# Patient Record
Sex: Male | Born: 1965 | Race: Black or African American | Hispanic: No | Marital: Married | Smoking: Never smoker
Health system: Southern US, Community
[De-identification: ages and names within clinical notes are randomized; demographics above are authoritative.]

## PROBLEM LIST (undated history)

## (undated) DIAGNOSIS — E669 Obesity, unspecified: Secondary | ICD-10-CM

## (undated) DIAGNOSIS — E119 Type 2 diabetes mellitus without complications: Secondary | ICD-10-CM

## (undated) DIAGNOSIS — I480 Paroxysmal atrial fibrillation: Secondary | ICD-10-CM

## (undated) DIAGNOSIS — F1010 Alcohol abuse, uncomplicated: Secondary | ICD-10-CM

## (undated) DIAGNOSIS — I10 Essential (primary) hypertension: Secondary | ICD-10-CM

## (undated) DIAGNOSIS — Z9289 Personal history of other medical treatment: Secondary | ICD-10-CM

## (undated) HISTORY — DX: Obesity, unspecified: E66.9

## (undated) HISTORY — DX: Type 2 diabetes mellitus without complications: E11.9

## (undated) HISTORY — DX: Paroxysmal atrial fibrillation: I48.0

## (undated) HISTORY — DX: Personal history of other medical treatment: Z92.89

---

## 2007-07-17 ENCOUNTER — Ambulatory Visit: Payer: Self-pay | Admitting: Internal Medicine

## 2015-01-18 ENCOUNTER — Inpatient Hospital Stay
Admission: EM | Admit: 2015-01-18 | Discharge: 2015-01-18 | DRG: 310 | Disposition: A | Discharge status: Home / Self Care | Payer: Self-pay | Attending: Internal Medicine | Admitting: Internal Medicine

## 2015-01-18 ENCOUNTER — Encounter: Payer: Self-pay | Admitting: Emergency Medicine

## 2015-01-18 ENCOUNTER — Inpatient Hospital Stay: Admit: 2015-01-18 | Payer: Self-pay

## 2015-01-18 ENCOUNTER — Inpatient Hospital Stay (HOSPITAL_COMMUNITY)
Admit: 2015-01-18 | Discharge: 2015-01-18 | Disposition: A | Discharge status: Home / Self Care | Payer: Self-pay | Attending: Internal Medicine | Admitting: Internal Medicine

## 2015-01-18 ENCOUNTER — Emergency Department: Payer: Self-pay

## 2015-01-18 DIAGNOSIS — I480 Paroxysmal atrial fibrillation: Principal | ICD-10-CM | POA: Diagnosis present

## 2015-01-18 DIAGNOSIS — I4891 Unspecified atrial fibrillation: Secondary | ICD-10-CM

## 2015-01-18 DIAGNOSIS — E119 Type 2 diabetes mellitus without complications: Secondary | ICD-10-CM | POA: Diagnosis present

## 2015-01-18 DIAGNOSIS — E876 Hypokalemia: Secondary | ICD-10-CM | POA: Diagnosis present

## 2015-01-18 DIAGNOSIS — Z9119 Patient's noncompliance with other medical treatment and regimen: Secondary | ICD-10-CM | POA: Diagnosis present

## 2015-01-18 DIAGNOSIS — I10 Essential (primary) hypertension: Secondary | ICD-10-CM | POA: Diagnosis present

## 2015-01-18 DIAGNOSIS — Z8249 Family history of ischemic heart disease and other diseases of the circulatory system: Secondary | ICD-10-CM

## 2015-01-18 HISTORY — DX: Alcohol abuse, uncomplicated: F10.10

## 2015-01-18 HISTORY — DX: Essential (primary) hypertension: I10

## 2015-01-18 LAB — CBC
HEMATOCRIT: 45.7 % (ref 40.0–52.0)
Hemoglobin: 15.5 g/dL (ref 13.0–18.0)
MCH: 31.9 pg (ref 26.0–34.0)
MCHC: 33.9 g/dL (ref 32.0–36.0)
MCV: 94.1 fL (ref 80.0–100.0)
PLATELETS: 160 10*3/uL (ref 150–440)
RBC: 4.86 MIL/uL (ref 4.40–5.90)
RDW: 13 % (ref 11.5–14.5)
WBC: 4 10*3/uL (ref 3.8–10.6)

## 2015-01-18 LAB — BASIC METABOLIC PANEL
ANION GAP: 12 (ref 5–15)
Anion gap: 7 (ref 5–15)
BUN: 10 mg/dL (ref 6–20)
BUN: 12 mg/dL (ref 6–20)
CHLORIDE: 102 mmol/L (ref 101–111)
CO2: 25 mmol/L (ref 22–32)
CO2: 28 mmol/L (ref 22–32)
CREATININE: 0.81 mg/dL (ref 0.61–1.24)
Calcium: 9.2 mg/dL (ref 8.9–10.3)
Calcium: 9.3 mg/dL (ref 8.9–10.3)
Chloride: 105 mmol/L (ref 101–111)
Creatinine, Ser: 0.85 mg/dL (ref 0.61–1.24)
GFR calc Af Amer: >60 mL/min (ref >60)
GLUCOSE: 160 mg/dL — AB (ref 65–99)
Glucose, Bld: 232 mg/dL — ABNORMAL HIGH (ref 65–99)
POTASSIUM: 3 mmol/L — AB (ref 3.5–5.1)
Potassium: 4.4 mmol/L (ref 3.5–5.1)
SODIUM: 139 mmol/L (ref 135–145)
SODIUM: 140 mmol/L (ref 135–145)

## 2015-01-18 LAB — ETHANOL: Alcohol, Ethyl (B): 17 mg/dL — ABNORMAL HIGH (ref <5)

## 2015-01-18 LAB — TSH: TSH: 0.838 u[IU]/mL (ref 0.350–4.500)

## 2015-01-18 LAB — URINE DRUG SCREEN, QUALITATIVE (ARMC ONLY)
Amphetamines, Ur Screen: NOT DETECTED
BARBITURATES, UR SCREEN: NOT DETECTED
BENZODIAZEPINE, UR SCRN: NOT DETECTED
COCAINE METABOLITE, UR ~~LOC~~: NOT DETECTED
Cannabinoid 50 Ng, Ur ~~LOC~~: NOT DETECTED
MDMA (Ecstasy)Ur Screen: NOT DETECTED
METHADONE SCREEN, URINE: NOT DETECTED
Opiate, Ur Screen: NOT DETECTED
Phencyclidine (PCP) Ur S: NOT DETECTED
TRICYCLIC, UR SCREEN: NOT DETECTED

## 2015-01-18 LAB — TROPONIN I
TROPONIN I: 0.04 ng/mL — AB (ref <0.031)
Troponin I: <0.03 ng/mL (ref <0.031)

## 2015-01-18 LAB — PHOSPHORUS: Phosphorus: 3.8 mg/dL (ref 2.5–4.6)

## 2015-01-18 LAB — MAGNESIUM: Magnesium: 2.2 mg/dL (ref 1.7–2.4)

## 2015-01-18 LAB — HEMOGLOBIN A1C: HEMOGLOBIN A1C: 6.6 % — AB (ref 4.0–6.0)

## 2015-01-18 MED ORDER — ASPIRIN 81 MG PO TBEC: 81.0000 mg | DELAYED_RELEASE_TABLET | Freq: Every day | ORAL | Status: AC

## 2015-01-18 MED ORDER — DILTIAZEM HCL ER COATED BEADS 180 MG PO CP24
180.0000 mg | ORAL_CAPSULE | Freq: Every day | ORAL | Status: DC
Start: 2015-01-18 — End: 2015-02-12

## 2015-01-18 MED ORDER — DILTIAZEM HCL 60 MG PO TABS
60.0000 mg | ORAL_TABLET | Freq: Four times a day (QID) | ORAL | Status: DC
  Administered 2015-01-18: 60 mg via ORAL
  Filled 2015-01-18: qty 2

## 2015-01-18 MED ORDER — METOPROLOL TARTRATE 25 MG PO TABS
25.0000 mg | ORAL_TABLET | Freq: Two times a day (BID) | ORAL | Status: DC
  Administered 2015-01-18: 25 mg via ORAL
  Filled 2015-01-18: qty 1

## 2015-01-18 MED ORDER — METOPROLOL TARTRATE 1 MG/ML IV SOLN
5.0000 mg | Freq: Once | INTRAVENOUS | Status: AC
  Administered 2015-01-18: 5 mg via INTRAVENOUS
  Filled 2015-01-18: qty 5

## 2015-01-18 MED ORDER — DILTIAZEM HCL ER COATED BEADS 120 MG PO CP24
120.0000 mg | ORAL_CAPSULE | Freq: Every day | ORAL | Status: DC
Start: 2015-01-18 — End: 2015-01-18

## 2015-01-18 MED ORDER — METOPROLOL TARTRATE 25 MG PO TABS: 25.0000 mg | ORAL_TABLET | Freq: Two times a day (BID) | ORAL | Status: DC

## 2015-01-18 MED ORDER — ASPIRIN EC 81 MG PO TBEC
81.0000 mg | DELAYED_RELEASE_TABLET | Freq: Every day | ORAL | Status: DC
  Administered 2015-01-18: 81 mg via ORAL
  Filled 2015-01-18: qty 1

## 2015-01-18 MED ORDER — ENOXAPARIN SODIUM 40 MG/0.4ML ~~LOC~~ SOLN
40.0000 mg | SUBCUTANEOUS | Status: DC
  Administered 2015-01-18: 40 mg via SUBCUTANEOUS
  Filled 2015-01-18: qty 0.4

## 2015-01-18 MED ORDER — ENOXAPARIN SODIUM 40 MG/0.4ML ~~LOC~~ SOLN: 40.0000 mg | SUBCUTANEOUS | Status: DC

## 2015-01-18 MED ORDER — DILTIAZEM HCL ER COATED BEADS 120 MG PO CP24
120.0000 mg | ORAL_CAPSULE | Freq: Every day | ORAL | Status: DC
  Administered 2015-01-18: 120 mg via ORAL
  Filled 2015-01-18: qty 1

## 2015-01-18 MED ORDER — SODIUM CHLORIDE 0.9 % IJ SOLN
3.0000 mL | Freq: Two times a day (BID) | INTRAMUSCULAR | Status: DC
  Administered 2015-01-18: 3 mL via INTRAVENOUS

## 2015-01-18 MED ORDER — METOPROLOL TARTRATE 50 MG PO TABS: 50.0000 mg | ORAL_TABLET | Freq: Two times a day (BID) | ORAL | Status: DC

## 2015-01-18 MED ORDER — POTASSIUM CHLORIDE CRYS ER 20 MEQ PO TBCR
40.0000 meq | EXTENDED_RELEASE_TABLET | Freq: Once | ORAL | Status: AC
  Administered 2015-01-18: 40 meq via ORAL
  Filled 2015-01-18: qty 2

## 2015-01-18 MED ORDER — DILTIAZEM HCL 25 MG/5ML IV SOLN
20.0000 mg | Freq: Once | INTRAVENOUS | Status: AC
  Administered 2015-01-18: 20 mg via INTRAVENOUS
  Filled 2015-01-18: qty 5

## 2015-01-18 NOTE — Care Management (Signed)
Patient admitted with new onset of atrial fib.  He has no payor. He is employed and his wife is employed with school system.  She has insurance but it costs too much to put patient on the policy.  Patient's take home salary a month is 2500 dollar.  This does not include his wife's salary.  There is one dependent in addition to his wife.  Patient and wife are gong to look at some more "Obama Care" policies.  Patient has not been seen by a physician in over a year.  Discussed the need to  Get reestablished with a primary care physician.   Discussed that CM will continue to assess for discharge meds to determine if any assistance is needed.

## 2015-01-18 NOTE — Progress Notes (Signed)
*  PRELIMINARY RESULTS* Echocardiogram 2D Echocardiogram has been performed.  Jordan Cochran 01/18/2015, 4:31 PM

## 2015-01-18 NOTE — H&P (Signed)
[facility] - [place] at [facility]   PATIENT NAME: Jordan Cochran    MR#:  [id number]  DATE OF BIRTH:  1966/04/20  DATE OF ADMISSION:  01/18/2015  PRIMARY CARE PHYSICIAN: No PCP Per Patient   REQUESTING/REFERRING PHYSICIAN: Dr. Manson Passey  CHIEF COMPLAINT:   Chief Complaint  Patient presents with  . Palpitations    HISTORY OF PRESENT ILLNESS:  Jordan Cochran  is a 49 y.o. male with a known history of hypertension, not on any medications presents to the emergency room with the complaints of episode of palpitations. Patient states that when he woke up from sleep to go to the bathroom, he felt palpitations hence called EMS who found him with atrial fibrillation with rapid ventricular rate. He was given IV Cardizem 20 m by the EMS and brought to the emergency room for further evaluation. In the emergency room patient was found to be still with atrial fibrillation with rapid ventricular rate, stable blood pressure, was given additional dose of 20 mg IV Cardizem following which his heart rate, controlled and currently staying in the 90s. Denies any associated chest pain, dizziness, nausea, vomiting, diarrhea, diaphoresis. No history of similar symptoms in the past. Further workup revealed potassium of 3.0, blood sugar 232, troponin less than 0.03, EtOH level 17, urine tox negative. Chest x-ray negative for any acute cardiopulmonary pathology. EKG atrial fibrillation with rapid ventricular rate of 1 44 bpm. Patient admits having couple of beers before going to bed last night. Hospitalist service was consulted for further management. Patient is currently comfortably resting in the bed and denies any complaints such as  chest pain, palpitations, shortness of breath, dizziness.  PAST MEDICAL HISTORY:   Past Medical History  Diagnosis Date  . Hypertension     PAST SURGICAL HISTORY:  History reviewed. No pertinent past surgical history.  SOCIAL HISTORY:   Social History   Substance Use Topics  . Smoking status: Never Smoker   . Smokeless tobacco: Not on file  . Alcohol Use: Yes     Comment: 3 drinks and 2 shots today. 4/7 days    FAMILY HISTORY:   Family History  Problem Relation Age of Onset  . Hypertension Mother   . Hypertension Father   . Hypertension Sister   . Hypertension Brother     DRUG ALLERGIES:  No Known Allergies  REVIEW OF SYSTEMS:   Review of Systems  Constitutional: Negative for fever, chills and malaise/fatigue.  HENT: Negative for ear pain, hearing loss, nosebleeds, sore throat and tinnitus.   Eyes: Negative for blurred vision, double vision, pain, discharge and redness.  Respiratory: Negative for cough, hemoptysis, sputum production, shortness of breath and wheezing.   Cardiovascular: Positive for palpitations. Negative for chest pain, orthopnea and leg swelling.  Gastrointestinal: Negative for nausea, vomiting, abdominal pain, diarrhea, constipation, blood in stool and melena.  Genitourinary: Negative for dysuria, urgency, frequency and hematuria.  Musculoskeletal: Negative for back pain, joint pain and neck pain.  Skin: Negative for itching and rash.  Neurological: Negative for dizziness, tingling, sensory change, focal weakness and seizures.  Endo/Heme/Allergies: Does not bruise/bleed easily.  Psychiatric/Behavioral: Negative for depression. The patient is not nervous/anxious.     MEDICATIONS AT HOME:   Prior to Admission medications   Not on File      VITAL SIGNS:  Blood pressure 176/135, pulse 112, temperature 98.6 F (37 C), temperature source Oral, resp. rate 14, height  (1.702 m), weight 88.451 kg (195 lb), SpO2 98 %.  PHYSICAL EXAMINATION:  Physical Exam  Constitutional: He is oriented to person, place, and time. He appears well-developed and well-nourished. No distress.  HENT:  Head: Normocephalic and atraumatic.  Right Ear: External ear normal.  Left Ear: External ear normal.  Nose: Nose  normal.  Mouth/Throat: Oropharynx is clear and moist. No oropharyngeal exudate.  Eyes: EOM are normal. Pupils are equal, round, and reactive to light. No scleral icterus.  Neck: Normal range of motion. Neck supple. No JVD present. No thyromegaly present.  Cardiovascular: Normal rate, regular rhythm, normal heart sounds and intact distal pulses.  Exam reveals no friction rub.   No murmur heard. Respiratory: Effort normal and breath sounds normal. No respiratory distress. He has no wheezes. He has no rales. He exhibits no tenderness.  GI: Soft. Bowel sounds are normal. He exhibits no distension and no mass. There is no tenderness. There is no rebound and no guarding.  Musculoskeletal: Normal range of motion. He exhibits no edema.  Lymphadenopathy:    He has no cervical adenopathy.  Neurological: He is alert and oriented to person, place, and time. He has normal reflexes. He displays normal reflexes. No cranial nerve deficit. He exhibits normal muscle tone.  Skin: Skin is warm. No rash noted. No erythema.  Psychiatric: He has a normal mood and affect. His behavior is normal. Thought content normal.   LABORATORY PANEL:   CBC  Recent Labs Lab 01/18/15 0050  WBC 4.0  HGB 15.5  HCT 45.7  PLT 160   ------------------------------------------------------------------------------------------------------------------  Chemistries   Recent Labs Lab 01/18/15 0050  NA 139  K 3.0*  CL 102  CO2 25  GLUCOSE 232*  BUN 12  CREATININE 0.85  CALCIUM 9.2   ------------------------------------------------------------------------------------------------------------------  Cardiac Enzymes  Recent Labs Lab 01/18/15 0050  TROPONINI <0.03   ------------------------------------------------------------------------------------------------------------------  RADIOLOGY:  Dg Chest Port 1 View  01/18/2015   CLINICAL DATA:  Palpitations.  EXAM: PORTABLE CHEST 1 VIEW  COMPARISON:  None.  FINDINGS:  The heart is at the upper limits of normal in size. Mediastinal contours are normal. Pulmonary vasculature is normal. No consolidation, pleural effusion, or pneumothorax. No acute osseous abnormalities are seen.  IMPRESSION: Borderline cardiomegaly without localizing pulmonary process or congestive failure.   Electronically Signed   By: Rubye Oaks M.D.   On: 01/18/2015 01:24    EKG:   Orders placed or performed during the hospital encounter of 01/18/15  . EKG 12-Lead  . EKG 12-Lead  . ED EKG  . ED EKG  80 fibrillation with ventricular rate of 1 44 bpm, nonspecific ST-T abnormality.  IMPRESSION AND PLAN:   49 year old male with history of hypertension, not on any medications was brought into the emergency room by EMS with the complaints of palpitations, found to be in atrial fibrillation with rapid ventricular rate, received IV Cardizem 20 mg 2, currently heart rate maintaining in the 90s. 1. Atrial fibrillation with rapid ventricular rate, new onset. Likely triggered by alcohol usage. Rule out acute coronary event, rule out thyroid disease. History of hypertension, not using any medications currently. Patient hemodynamically stable. Plan: Admit to telemetry, cycle cardiac enzymes, continue aspirin. Start by mouth Cardizem 60 mg every 6 hours. Order echocardiogram, request cardiology consultation for further evaluation and advice. Check TSH. We will not start anti-combination since [place] score is 1 2. Hypertension, patient not using any medications at present. Plan: Start Cardizem, follow BP measurements closely. 3. Hypokalemia. Replace potassium. Check magnesium and phosphorus levels and follow-up accordingly. 4. Elevated blood  sugars of 232. No history of diabetes mellitus. Plan: Check HbA1c, follow-up accordingly.    All the records are reviewed and case discussed with ED Zyann Mabry. Management plans discussed with the patient, family and they are in agreement.  CODE STATUS: Full  code  TOTAL TIME TAKING CARE OF THIS PATIENT: 50 minutes.    Jonnie Kind N M.D on 01/18/2015 at 3:17 AM  Between 7am to 6pm - Pager - [phone]  After 6pm go to [email or web address] - password [email or web address] [facility]  [facility]  Office  [phone]  CC: Primary care physician; No PCP Per Patient

## 2015-01-18 NOTE — ED Notes (Signed)
Pt to ED from home via EMS c/o palpitations that started tonight around midnight after getting up to use the bathroom.  Pt states thought it might have been gas but "my heart just kept racing".  Per EMS pt has hx of HTN, took self off BP medication.  Pt presented in A.fib with EMS, NSR upon admission to ED, and back into a.fib.  EMS gave  cardiazem en route.  Pt is A&Ox4, speaking in complete and coherent sentences and in NAD at this time.

## 2015-01-18 NOTE — Discharge Instructions (Signed)
°  DIET:  Cardiac diet and Diabetic diet  DISCHARGE CONDITION:  Stable  ACTIVITY:  Activity as tolerated  OXYGEN:  Home Oxygen: No.   Oxygen Delivery: room air  DISCHARGE LOCATION:  home   If you experience worsening of your admission symptoms, develop shortness of breath, life threatening emergency, suicidal or homicidal thoughts you must seek medical attention immediately by calling 911 or calling your MD immediately  if symptoms less severe.  You Must read complete instructions/literature along with all the possible adverse reactions/side effects for all the Medicines you take and that have been prescribed to you. Take any new Medicines after you have completely understood and accpet all the possible adverse reactions/side effects.   Please note  You were cared for by a hospitalist during your hospital stay. If you have any questions about your discharge medications or the care you received while you were in the hospital after you are discharged, you can call the unit and asked to speak with the hospitalist on call if the hospitalist that took care of you is not available. Once you are discharged, your primary care physician will handle any further medical issues. Please note that NO REFILLS for any discharge medications will be authorized once you are discharged, as it is imperative that you return to your primary care physician (or establish a relationship with a primary care physician if you do not have one) for your aftercare needs so that they can reassess your need for medications and monitor your lab values.  Please take medications as directed here.  Prescriptions for cardizem 120 mg and metoprolol 25 mg were mistakenly sent earlier to pharmacy and you dont need them.

## 2015-01-18 NOTE — ED Provider Notes (Signed)
[facility] Emergency Department Provider Note  ____________________________________________  Time seen: 12:45 AM  I have reviewed the triage vital signs and the nursing notes.   HISTORY  Chief Complaint Palpitations     HPI Jordan Cochran is a 49 y.o. male presents with acute onset of palpitations tonight around midnight. Patient admits to the sensation as though his heart was "racing". Patient admits to left lower chest discomfort along with palpitation which she describes as mild. On EMS arrival EKG revealed atrial fibrillation with rapid ventricular response with a heart rate of 140-180. as such patient was given 20 mg of Cardizem with improvement of heart rate to low 100s". In addition patient was noted to be markedly hypertensive on EMS arrival with a systolic blood pressure of 240. Patient denies any dizziness no nausea or vomiting. Of note patient does admit to alcohol ingestion last night at approximately 7 PM. Patient denies any illicit drug use. In addition patient has a history of hypertension however he admits to being noncompliant with his antihypertensives.     Past Medical History  Diagnosis Date  . Hypertension     There are no active problems to display for this patient.   History reviewed. No pertinent past surgical history.  No current outpatient prescriptions on file.  Allergies Review of patient's allergies indicates no known allergies.  History reviewed. No pertinent family history.  Social History Social History  Substance Use Topics  . Smoking status: Never Smoker   . Smokeless tobacco: None  . Alcohol Use: Yes     Comment: 3 drinks and 2 shots today. 4/7 days    Review of Systems  Constitutional: Negative for fever. Eyes: Negative for visual changes. ENT: Negative for sore throat. Cardiovascular: Positive for chest pain and palpitations Respiratory: Negative for shortness of breath. Gastrointestinal: Negative  for abdominal pain, vomiting and diarrhea. Genitourinary: Negative for dysuria. Musculoskeletal: Negative for back pain. Skin: Negative for rash. Neurological: Negative for headaches, focal weakness or numbness.   10-point ROS otherwise negative.  ____________________________________________   PHYSICAL EXAM:  VITAL SIGNS: ED Triage Vitals  Enc Vitals Group     BP 01/18/15 0057 200/145 mmHg     Pulse Rate 01/18/15 0057 125     Resp 01/18/15 0057 20     Temp 01/18/15 0057 98.6 F (37 C)     Temp Source 01/18/15 0057 Oral     SpO2 01/18/15 0057 99 %     Weight 01/18/15 0057 195 lb (88.451 kg)     Height 01/18/15 0057  (1.702 m)     Head Cir --      Peak Flow --      Pain Score --      Pain Loc --      Pain Edu? --      Excl. in GC? --      Constitutional: Alert and oriented. Well appearing and in no distress. Eyes: Conjunctivae are normal. PERRL. Normal extraocular movements. ENT   Head: Normocephalic and atraumatic.   Nose: No congestion/rhinnorhea.   Mouth/Throat: Mucous membranes are moist.   Neck: No stridor. Hematological/Lymphatic/Immunilogical: No cervical lymphadenopathy. Cardiovascular: Irregular regular rhythm with tachycardia. Normal and symmetric distal pulses are present in all extremities. No murmurs, rubs, or gallops. Respiratory: Normal respiratory effort without tachypnea nor retractions. Breath sounds are clear and equal bilaterally. No wheezes/rales/rhonchi. Gastrointestinal: Soft and nontender. No distention. There is no CVA tenderness. Genitourinary: deferred Musculoskeletal: Nontender with normal range of motion in  all extremities. No joint effusions.  No lower extremity tenderness nor edema. Neurologic:  Normal speech and language. No gross focal neurologic deficits are appreciated. Speech is normal.  Skin:  Skin is warm, dry and intact. No rash noted. Psychiatric: Mood and affect are normal. Speech and behavior are normal.  Patient exhibits appropriate insight and judgment.  ____________________________________________    LABS (pertinent positives/negatives)  Labs Reviewed  BASIC METABOLIC PANEL - Abnormal; Notable for the following:    Potassium 3.0 (*)    Glucose, Bld 232 (*)    All other components within normal limits  ETHANOL - Abnormal; Notable for the following:    Alcohol, Ethyl (B) 17 (*)    All other components within normal limits  CBC  TROPONIN I  URINE DRUG SCREEN, QUALITATIVE ([facility] ONLY)     ____________________________________________   EKG  ED ECG REPORT I, BROWN, [place] N, the attending physician, personally viewed and interpreted this ECG.   Date: 01/18/2015  EKG Time: 12:46 AM  Rate: 149  Rhythm: Atrial fibrillation with rapid ventricular response  Axis: None  Intervals: Normal  ST&T Change: None   ____________________________________________    RADIOLOGY   DG Chest Port 1 View (Final result) Result time: 01/18/15 01:24:14   Final result by Rad Results In Interface (01/18/15 01:24:14)   Narrative:   CLINICAL DATA: Palpitations.  EXAM: PORTABLE CHEST 1 VIEW  COMPARISON: None.  FINDINGS: The heart is at the upper limits of normal in size. Mediastinal contours are normal. Pulmonary vasculature is normal. No consolidation, pleural effusion, or pneumothorax. No acute osseous abnormalities are seen.  IMPRESSION: Borderline cardiomegaly without localizing pulmonary process or congestive failure.   Electronically Signed By: Rubye Oaks M.D. On: 01/18/2015 01:24           ____________________________________________     Critical Care performed: CRITICAL CARE Performed by: Bayard Males N   Total critical care time: 30 minutes  Critical care time was exclusive of separately billable procedures and treating other patients.  Critical care was necessary to treat or prevent imminent or life-threatening  deterioration.  Critical care was time spent personally by me on the following activities: development of treatment plan with patient and/or surrogate as well as nursing, discussions with consultants, evaluation of patient's response to treatment, examination of patient, obtaining history from patient or surrogate, ordering and performing treatments and interventions, ordering and review of laboratory studies, ordering and review of radiographic studies, pulse oximetry and re-evaluation of patient's condition.   ____________________________________________   INITIAL IMPRESSION / ASSESSMENT AND PLAN / ED COURSE  Pertinent labs & imaging results that were available during my care of the patient were reviewed by me and considered in my medical decision making (see chart for details).  Spoke with patient and family at length regarding all clinical findings. Patient received Cardizem 20 mg IV in the emergency Department with improvement of heart rate to 80s to 90s however patient remained in atrial fibrillation.  ____________________________________________   FINAL CLINICAL IMPRESSION(S) / ED DIAGNOSES  Final diagnoses:  Atrial fibrillation with rapid ventricular response  New onset type 2 diabetes mellitus      Darci Current, MD 01/18/15 2303

## 2015-01-18 NOTE — Progress Notes (Signed)
Discharge instructions explained to pt and pts spouse/ verbalized an understanding/ iv and tele removed/ refused wheelchair/ requested to walk off unit for discharge

## 2015-01-18 NOTE — Consult Note (Signed)
Cardiology Consultation Note  Patient ID: Jordan Cochran, MRN: [id number], DOB/AGE: 1965/06/14 49 y.o. Admit date: 01/18/2015   Date of Consult: 01/18/2015 Primary Physician: No PCP Per Patient Primary Cardiologist: New to [facility]  Chief Complaint: Palpitations  Reason for Consult: New onset Afib with RVR  HPI: 49 y.o. male with h/o hypertension and alcohol abuse who presented to [facility] on 9/29 with new onset Afib with RVR with heart rates in the 140's.   No previously known cardiac history. He has never seen a cardiologist before. No prior echos, stress tests, or cardiac caths. He does not seek care as an outpatient routinely though typically states his BP runs in the 140 to 150 systolic range. He drinks alcohol every weekend with his friends a case at a time split between 6-8 guys. He denies any tobacco or illegal drug abuse. He denies any recent illnesses, injuries, diarrhea, vomiting, or increased stressors.   When he went to bed on 9/28 he was feeling fine. No increased OSB, chest pain, palpitations, nausea, vomiting, presyncope, or syncope. He got up around 11:40 to use the restroom. Upon getting up he noticed some tachy-palpitations. No SOB, chest pain, nausea, vomiting, presyncope, or syncope at this time. He has never had a similar sensation before. His weight has been stable. No increased LEE. He presented to [facility] ED for further evaluation. He was given IV Cardizem 20 mg by EMS.     Upon his arrival to [facility] ED he was found to be in new onset Afib with RVR with heart rates in the 140's. In the ED he was given another bolus of IV Cardizem 20 mg with his heart rate staying in the 90's, in Afib. Troponin was found to be 0.04, ECG Afib with RVR, 149 bpm, inferolateral st depression, CXR borline cardiomegaly without acute process, K+3.0-->4.4 s/p repletion, Mg++ 2.2, TSH 0.838, urine drug screen negative, ethanol 17, CBC unremarkable, SCr 0.85. Overnight he has remained in the 70's in Afib, with  ambulation in his room. He denies any symptoms this morning.          Past Medical History  Diagnosis Date  . Hypertension   . ETOH abuse       Most Recent Cardiac Studies: Echo pending   Surgical History: History reviewed. No pertinent past surgical history.   Home Meds: Prior to Admission medications   Not on File    Inpatient Medications:  . aspirin EC  81 mg Oral Daily  . diltiazem  60 mg Oral 4 times per day  . enoxaparin (LOVENOX) injection  40 mg Subcutaneous Q24H  . sodium chloride  3 mL Intravenous Q12H      Allergies: No Known Allergies  Social History   Social History  . Marital Status: Married    Spouse Name: N/A  . Number of Children: N/A  . Years of Education: N/A   Occupational History  . Not on file.   Social History Main Topics  . Smoking status: Never Smoker   . Smokeless tobacco: Not on file  . Alcohol Use: Yes     Comment: 3 drinks and 2 shots today. 4/7 days  . Drug Use: No  . Sexual Activity: Not on file   Other Topics Concern  . Not on file   Social History Narrative  . No narrative on file     Family History  Problem Relation Age of Onset  . Hypertension Mother   . Hypertension Father   . Hypertension Sister   .  Hypertension Brother      Review of Systems: Review of Systems  Constitutional: Negative for fever, chills, weight loss, malaise/fatigue and diaphoresis.  HENT: Negative for congestion.   Eyes: Negative for discharge and redness.  Respiratory: Negative for cough, hemoptysis, sputum production, shortness of breath and wheezing.   Cardiovascular: Positive for palpitations. Negative for chest pain, orthopnea, claudication, leg swelling and PND.  Gastrointestinal: Negative for nausea, vomiting, blood in stool and melena.  Genitourinary: Negative for hematuria.  Musculoskeletal: Negative for myalgias, back pain, joint pain, falls and neck pain.  Skin: Negative for rash.  Neurological: Negative for dizziness,  tingling, tremors, sensory change, speech change, focal weakness, seizures, loss of consciousness, weakness and headaches.  Endo/Heme/Allergies: Does not bruise/bleed easily.  Psychiatric/Behavioral: Positive for substance abuse. The patient is not nervous/anxious.        ETOH abuse  All other systems reviewed and are negative.   Labs:  Recent Labs  01/18/15 0050 01/18/15 0608  TROPONINI <0.03 0.04*   Lab Results  Component Value Date   WBC 4.0 01/18/2015   HGB 15.5 01/18/2015   HCT 45.7 01/18/2015   MCV 94.1 01/18/2015   PLT 160 01/18/2015     Recent Labs Lab 01/18/15 0608  NA 140  K 4.4  CL 105  CO2 28  BUN 10  CREATININE 0.81  CALCIUM 9.3  GLUCOSE 160*   No results found for: CHOL, HDL, LDLCALC, TRIG No results found for: DDIMER  Radiology/Studies:  Dg Chest Port 1 View  01/18/2015   CLINICAL DATA:  Palpitations.  EXAM: PORTABLE CHEST 1 VIEW  COMPARISON:  None.  FINDINGS: The heart is at the upper limits of normal in size. Mediastinal contours are normal. Pulmonary vasculature is normal. No consolidation, pleural effusion, or pneumothorax. No acute osseous abnormalities are seen.  IMPRESSION: Borderline cardiomegaly without localizing pulmonary process or congestive failure.   Electronically Signed   By: Rubye Oaks M.D.   On: 01/18/2015 01:24    EKG: Afib with RVR, 149 bpm, inferolateral st depression   Weights: Filed Weights   01/18/15 0057  Weight: 195 lb (88.451 kg)     Physical Exam: Blood pressure 150/102, pulse 68, temperature 98.6 F (37 C), temperature source Oral, resp. rate 18, height  (1.702 m), weight 195 lb (88.451 kg), SpO2 99 %. Body mass index is 30.53 kg/(m^2). General: Well developed, well nourished, in no acute distress. Head: Normocephalic, atraumatic, sclera non-icteric, no xanthomas, nares are without discharge.  Neck: Negative for carotid bruits. JVD not elevated. Lungs: Clear bilaterally to auscultation without wheezes,  rales, or rhonchi. Breathing is unlabored. Heart: Irregularly-irregular, with S1 S2. No murmurs, rubs, or gallops appreciated. Abdomen: Soft, non-tender, non-distended with normoactive bowel sounds. No hepatomegaly. No rebound/guarding. No obvious abdominal masses. Msk:  Strength and tone appear normal for age. Extremities: No clubbing or cyanosis. No edema.  Distal pedal pulses are 2+ and equal bilaterally. Neuro: Alert and oriented X 3. No facial asymmetry. No focal deficit. Moves all extremities spontaneously. Psych:  Responds to questions appropriately with a normal affect.    Assessment and Plan:  49 y.o. male with h/o hypertension and alcohol abuse who presented to [facility] on 9/29 with new onset Afib with RVR with heart rates in the 140's.  1. New onset Afib with RVR: -Currently rate controlled in Afib with heart rates in the 70's on diltiazem 60 mg q 6 hours -Went to bed feeling fine on 9/28, woke up around 11:40 PM to use the  restroom and had tachy-palpitations and presented to the ED as above -He had drank a fair amount of tequila on the afternoon, which he usually does not drink. Was also found to be hypokalemic at 3.0, which has subsequently been repleted to 4.4  -Patient has been in Afib less than 48 hours, would be ok to convert to sinus rhythm -Ideally would like to avoid amiodarone given his young age -Given the correction of his hypokalemia and speed of his heart rate he may be preparing to convert on his own -Consolidate diltiazem to Cardizem CD 120 mg daily -Add Lopressor 25 mg bid -If he does not convert with the above could consider loading dose of amiodarone for conversion, though would not use long term given his age -Await echo to evaluate LV function -Given his CHADSVASc of at least 1 at this time (HTN) he would not need long term full dose anticoagulation at this time -Continue aspirin     2. HTN: -Add Lopressor as above -Cardizem as above -Monitor  3.  Hypokalemia: -Repleted  4. Hyperglycemia: -A1C pending   Elinor Dodge, PA-C Pager: [phone] 01/18/2015, 10:00 AM

## 2015-01-18 NOTE — Progress Notes (Signed)
Pt has converted to nsr- ambulated around nurses station- tolerated well- heart remains in nsr hr 70s

## 2015-01-19 NOTE — Care Management (Signed)
Patient was discharged after CM hours.  Discharged on cardizem.  Based on discussion of finances earlier in the day, would not anticipate this medication would cause financial hardship.  Has follow up appt with Dr Kirke Corin in one week

## 2015-01-19 NOTE — Discharge Summary (Signed)
[facility] - [place] at [facility]   PATIENT NAME: Jordan Cochran    MR#:  [id number]  DATE OF BIRTH:  08/04/1965  DATE OF ADMISSION:  01/18/2015 ADMITTING PHYSICIAN: Crissie Figures, MD  DATE OF DISCHARGE: 01/18/2015  5:44 PM  PRIMARY CARE PHYSICIAN: No PCP Per Patient    ADMISSION DIAGNOSIS:  Atrial fibrillation with rapid ventricular response [I48.91] New onset type 2 diabetes mellitus [E11.9]  DISCHARGE DIAGNOSIS:  Principal Problem:   Atrial fibrillation with RVR Active Problems:   HTN (hypertension)   Hypokalemia   SECONDARY DIAGNOSIS:   Past Medical History  Diagnosis Date  . Hypertension   . ETOH abuse      ADMITTING HISTORY  Tarrell Debes is a 49 y.o. male with a known history of hypertension, not on any medications presents to the emergency room with the complaints of episode of palpitations. Patient states that when he woke up from sleep to go to the bathroom, he felt palpitations hence called EMS who found him with atrial fibrillation with rapid ventricular rate. He was given IV Cardizem 20 m by the EMS and brought to the emergency room for further evaluation. In the emergency room patient was found to be still with atrial fibrillation with rapid ventricular rate, stable blood pressure, was given additional dose of 20 mg IV Cardizem following which his heart rate, controlled and currently staying in the 90s. Denies any associated chest pain, dizziness, nausea, vomiting, diarrhea, diaphoresis. No history of similar symptoms in the past. Further workup revealed potassium of 3.0, blood sugar 232, troponin less than 0.03, EtOH level 17, urine tox negative. Chest x-ray negative for any acute cardiopulmonary pathology. EKG atrial fibrillation with rapid ventricular rate of 1 44 bpm. Patient admits having couple of beers before going to bed last night. Hospitalist service was consulted for further management. Patient is currently comfortably  resting in the bed and denies any complaints such as chest pain, palpitations, shortness of breath, dizziness.   HOSPITAL COURSE:   *  New onset atrial fibrillation  Patient was given IV Cardizem initially. Later started on oral Cardizem. Also got one dose of oral metoprolol. Converted to normal sinus rhythm. Ambulated in the hallway without any tachycardia. chads score is 1. Started on oral aspirin. Seen by Dr. Kirke Corin who I have discussed the case with. Suggested 180 mg Cardizem daily and daily aspirin for his prescriptions have been given. Patient did have uncontrolled hypertension on admission which was not being treated and is much improved with Cardizem. TSH was normal. Needs follow up with cardiology and his primary care physician in one to 2 weeks. Echocardiogram showed no acute findings or valvular lesions. Cutting enzymes have been normal.   Stable for discharge home.   CONSULTS OBTAINED:  Treatment Team:  Iran Ouch, MD  DRUG ALLERGIES:  No Known Allergies  DISCHARGE MEDICATIONS:   Discharge Medication List as of 01/18/2015  5:39 PM    START taking these medications   Details  aspirin EC 81 MG EC tablet Take 1 tablet (81 mg total) by mouth daily., Starting 01/18/2015, Until Discontinued, No Print      CONTINUE these medications which have CHANGED   Details  diltiazem (CARDIZEM CD) 180 MG 24 hr capsule Take 1 capsule (180 mg total) by mouth daily., Starting 01/18/2015, Until Discontinued, Print         Today    VITAL SIGNS:  Blood pressure 128/94, pulse 72, temperature 98.4 F (36.9 C), temperature source  Oral, resp. rate 18, height  (1.702 m), weight 88.451 kg (195 lb), SpO2 100 %.  I/O:   Intake/Output Summary (Last 24 hours) at 01/19/15 1443 Last data filed at 01/18/15 1600  Gross per 24 hour  Intake      0 ml  Output      0 ml  Net      0 ml    PHYSICAL EXAMINATION:  Physical Exam  GENERAL:  49 y.o.-year-old patient lying in the bed with no  acute distress.  LUNGS: Normal breath sounds bilaterally, no wheezing, rales,rhonchi or crepitation. No use of accessory muscles of respiration.  CARDIOVASCULAR: S1, S2 normal. No murmurs, rubs, or gallops.  ABDOMEN: Soft, non-tender, non-distended. Bowel sounds present. No organomegaly or mass.  NEUROLOGIC: Moves all 4 extremities. PSYCHIATRIC: The patient is alert and oriented x 3.  SKIN: No obvious rash, lesion, or ulcer.   DATA REVIEW:   CBC  Recent Labs Lab 01/18/15 0050  WBC 4.0  HGB 15.5  HCT 45.7  PLT 160    Chemistries   Recent Labs Lab 01/18/15 0608  NA 140  K 4.4  CL 105  CO2 28  GLUCOSE 160*  BUN 10  CREATININE 0.81  CALCIUM 9.3  MG 2.2    Cardiac Enzymes  Recent Labs Lab 01/18/15 1710  TROPONINI <0.03    Microbiology Results  No results found for this or any previous visit.  RADIOLOGY:  Dg Chest Port 1 View  01/18/2015   CLINICAL DATA:  Palpitations.  EXAM: PORTABLE CHEST 1 VIEW  COMPARISON:  None.  FINDINGS: The heart is at the upper limits of normal in size. Mediastinal contours are normal. Pulmonary vasculature is normal. No consolidation, pleural effusion, or pneumothorax. No acute osseous abnormalities are seen.  IMPRESSION: Borderline cardiomegaly without localizing pulmonary process or congestive failure.   Electronically Signed   By: Rubye Oaks M.D.   On: 01/18/2015 01:24      Follow up with PCP in 1 week.  Management plans discussed with the patient, family and they are in agreement.  CODE STATUS:   TOTAL TIME TAKING CARE OF THIS PATIENT ON DAY OF DISCHARGE: more than 30 minutes.    Milagros Loll R M.D on 01/19/2015 at 2:43 PM  Between 7am to 6pm - Pager - [phone]  After 6pm go to [email or web address] - password [email or web address] [facility]  [place] [facility]  Office  [phone]  CC: Primary care physician; No PCP Per Patient     Note: This dictation was prepared with Dragon dictation along with smaller phrase  technology. Any transcriptional errors that result from this process are unintentional.

## 2015-01-22 ENCOUNTER — Telehealth: Payer: Self-pay

## 2015-01-22 NOTE — Telephone Encounter (Signed)
-----   Message from Coralee Rud sent at 01/22/2015 11:24 AM EDT ----- Regarding: tcm/ph 02/12/2015 Eula Listen, PA 2:00 Dr. Kirke Corin primary Cardiologist

## 2015-01-22 NOTE — Telephone Encounter (Signed)
TCM phone call  Left message on machine for patient to contact the office.

## 2015-01-22 NOTE — Telephone Encounter (Signed)
Attempted to contact pt regarding discharge from [facility] on 01/22/15. Left message on pt's vm asking him to call back w/ any questions or concerns regarding discharge instructions and/or medications.  Advised him of appt w/ Eula Listen, PA on 02/12/15 @ 2:00. Asked him to call back if he will be unable to keep this appt.

## 2015-02-12 ENCOUNTER — Encounter: Payer: Self-pay | Admitting: Physician Assistant

## 2015-02-12 ENCOUNTER — Ambulatory Visit (INDEPENDENT_AMBULATORY_CARE_PROVIDER_SITE_OTHER): Payer: Self-pay | Admitting: Physician Assistant

## 2015-02-12 ENCOUNTER — Other Ambulatory Visit: Payer: Self-pay

## 2015-02-12 VITALS — BP 140/78 | HR 60 | Ht 66.0 in | Wt 197.5 lb

## 2015-02-12 DIAGNOSIS — E139 Other specified diabetes mellitus without complications: Secondary | ICD-10-CM

## 2015-02-12 DIAGNOSIS — I480 Paroxysmal atrial fibrillation: Secondary | ICD-10-CM

## 2015-02-12 DIAGNOSIS — E669 Obesity, unspecified: Secondary | ICD-10-CM

## 2015-02-12 DIAGNOSIS — E119 Type 2 diabetes mellitus without complications: Secondary | ICD-10-CM | POA: Insufficient documentation

## 2015-02-12 DIAGNOSIS — E785 Hyperlipidemia, unspecified: Secondary | ICD-10-CM

## 2015-02-12 DIAGNOSIS — I4891 Unspecified atrial fibrillation: Secondary | ICD-10-CM

## 2015-02-12 DIAGNOSIS — F1010 Alcohol abuse, uncomplicated: Secondary | ICD-10-CM

## 2015-02-12 DIAGNOSIS — I10 Essential (primary) hypertension: Secondary | ICD-10-CM | POA: Insufficient documentation

## 2015-02-12 MED ORDER — DILTIAZEM HCL ER COATED BEADS 180 MG PO CP24: 180.0000 mg | ORAL_CAPSULE | Freq: Every day | ORAL | Status: DC

## 2015-02-12 MED ORDER — DILTIAZEM HCL ER COATED BEADS 180 MG PO CP24: 180.0000 mg | ORAL_CAPSULE | Freq: Every day | ORAL | Status: AC

## 2015-02-12 MED ORDER — HYDROCHLOROTHIAZIDE 12.5 MG PO CAPS: 12.5000 mg | ORAL_CAPSULE | Freq: Every day | ORAL | Status: DC

## 2015-02-12 NOTE — Patient Instructions (Addendum)
Medication Instructions:  Your physician has recommended you make the following change in your medication:  START taking hydrocholorothiazide  12.5mg  once per day BP: 156/80   Labwork: BMET, lipid  Testing/Procedures: none  Follow-Up: Your physician recommends that you schedule a follow-up appointment in: one month with Eula Listenyan Dunn, PA-C   Any Other Special Instructions Will Be Listed Below (If Applicable).     If you need a refill on your cardiac medications before your next appointment, please call your pharmacy.

## 2015-02-12 NOTE — Progress Notes (Signed)
Cardiology Hospital Follow Up Note:   Date of Encounter: 02/12/2015  ID: Jordan Cochran, DOB 1965/05/07, MRN [id number]  PCP: No PCP Per Patient Primary Cardiologist: Dr. Kirke Corin, MD  Chief Complaint  Patient presents with  . other    Follow up from [facility]; A-Fib. Meds reviewed by the patient verbally. "doing well."      HPI:  49 year old male with history of hypertension and alcohol abuse who presents for hospital follow up of recent admission to [facility] from on 9/29 to 9/29 for new onset Afib with RVR with heart rates in the 140's.   No previously known cardiac history. He does not seek care as an outpatient routinely though typically states his BP runs in the 140 to 150 systolic range. He drinks alcohol every weekend with his friends a case at a time split between 6-8 guys. He denies any tobacco or illegal drug abuse. No recent illnesses, injuries, diarrhea, vomiting, or increased stressors.   He went to bed on 9/28 feeling fine. He got up around 11:40 to use the restroom. Upon getting up he noticed some tachy-palpitations. No SOB, chest pain, nausea, vomiting, presyncope, or syncope. His weight has been stable. No increased LEE. He presented to [facility] ED for further evaluation and was found to be in new onset Afib with RVR with heart rates in the 140's. In the ED he was given a bolus of IV Cardizem 20 mg with his heart rate staying in the 90's, in Afib. Troponin was found to be 0.04, ECG Afib with RVR, 149 bpm, inferolateral st depression, CXR borline cardiomegaly without acute process, K+ 3.0-->4.4 s/p repletion, Mg++ 2.2, TSH 0.838, urine drug screen negative, ethanol 17, CBC unremarkable, SCr 0.85. Echo on 9/29 showed EF 55-60%, normal wall motion, and normal diastolic function. He eventually converted to NSR later in the day on 9/29. Given his CHADSVASc of 1 he was not started on long term full dose anticoagulation. He was discharged on Cardizem CD 180 mg for both his Afib and HTN.   He  feels well today and is without any complaints. He reports wanting to start eating better and start an exercise program. He eats mostly fried foods and cold cuts. He does not have a formal exercise regimen. He has cut back on his alcohol intake to 2-3 beers on Friday, Saturday, and Sunday. He no longer drinks hard liquor. He has not had any further palpitations. No chest pain, SOB, or diaphoresis. He is tolerating his Cardizem without issues.     Past Medical History  Diagnosis Date  . Hypertension   . ETOH abuse   . PAF (paroxysmal atrial fibrillation) (HCC)     a. not on long term full dose anticoagulation given CHADSVASc of 1 (HTN)  . History of echocardiogram     a. 12/2014: EF 55-60%, nl wall motion, nl diastolic fxn  . Diabetes mellitus (HCC)   : History reviewed. No pertinent past surgical history.: Family History  Problem Relation Age of Onset  . Hypertension Mother   . Hypertension Father   . Hypertension Sister   . Hypertension Brother   :  reports that he has never smoked. He does not have any smokeless tobacco history on file. He reports that he drinks alcohol. He reports that he does not use illicit drugs.:   Allergies:  No Known Allergies   Home Medications:  Current Outpatient Prescriptions  Medication Sig Dispense Refill  . aspirin EC 81 MG EC tablet Take  1 tablet (81 mg total) by mouth daily.    Marland Kitchen. diltiazem (CARDIZEM CD) 180 MG 24 hr capsule Take 1 capsule (180 mg total) by mouth daily. 30 capsule 0   No current facility-administered medications for this visit.     Review of Systems:  Review of Systems  Constitutional: Negative for fever, chills, weight loss, malaise/fatigue and diaphoresis.  HENT: Negative for congestion.   Eyes: Negative for discharge and redness.  Respiratory: Negative for cough, hemoptysis, sputum production, shortness of breath and wheezing.   Cardiovascular: Negative for chest pain, palpitations, orthopnea, claudication, leg  swelling and PND.  Gastrointestinal: Negative for heartburn, nausea, vomiting and abdominal pain.  Musculoskeletal: Negative for falls.  Skin: Negative for rash.  Neurological: Negative for dizziness, tingling, tremors, sensory change, speech change, focal weakness, loss of consciousness and weakness.  Endo/Heme/Allergies: Does not bruise/bleed easily.  Psychiatric/Behavioral: Positive for substance abuse. The patient is not nervous/anxious.        Ongoing alcohol usage     Physical Exam:  Blood pressure 140/78, pulse 60, height 5\' 6"  (1.676 m), weight 197 lb 8 oz (89.585 kg). BMI: Body mass index is 31.89 kg/(m^2). General: Pleasant, NAD. Psych: Normal affect. Responds to questions with normal affect.  Neuro: Alert and oriented X 3. Moves all extremities spontaneously. HEENT: Normocephalic, atraumatic. EOM intact bilaterally. Sclera anicteric.  Neck: Trachea midline. Supple without bruits or JVD. Lungs:  Respirations regular and unlabored, CTA bilaterally without wheezing, crackles, or rhonchi.  Heart: RRR, normal s3, s4. No murmurs, rubs, or gallops.  Abdomen: Soft, non-tender, non-distended, BS + x 4.  Extremities: No clubbing, cyanosis or edema. DP/PT/Radials 2+ and equal bilaterally.   Accessory Clinical Findings:  EKG: NSR, 60 bpm, no significant st/t changes  Recent Labs: 01/18/2015: BUN 10; Creatinine, Ser 0.81; Hemoglobin 15.5; Magnesium 2.2; Platelets 160; Potassium 4.4; Sodium 140; TSH 0.838  No results found for: CHOL, TRIG, HDL, CHOLHDL, VLDL, LDLCALC, LDLDIRECT  Weights: Wt Readings from Last 3 Encounters:  02/12/15 197 lb 8 oz (89.585 kg)  01/18/15 195 lb (88.451 kg)    CrCl cannot be calculated (Patient has no serum creatinine result on file.).   Other studies Reviewed: Additional studies/ records that were reviewed today include: [facility] admission.  Assessment & Plan:  1. PAF:  -He remains in sinus rhythm today with heart rate of 60 -Continue Cardizem CD  180 mg daily -With CHADSVASc of 1 (HTN) no indication for long term full dose anticoagulation at this time -He amy continue aspirin 81 mg daily at this time, though this is not adequate for stroke prevention  -Episode of atrial fibrillation that prompted him to come to the hospital, per the HPI, likely in the setting of excessive alcohol intake and hypokalemia. These have been discussed with him in detail   2. HTN:  -Blood pressure is slightly elevated today with reading of 140/78, recheck BP remained elevated at >140 systolic -Add HCTZ 12.5 mg daily -Continue Cardizem CD 180 mg daily -Lifestyle changes and exercise routine were discussed in detail  3. History of hypokalemia:  -Check bmet   4. DM2:  -Per PCP  -Check lipid panel -Continue aspirin 81 mg daily   5. Alcohol abuse:  -He has tapered his usage and no longer drinks hard liquor  -Cessation advised, and recommended he continue to taper usage to 0 drinks on the weekends   Dispo: -Follow up one month  Current medicines are reviewed at length with the patient today.  The patient did not  have any concerns regarding medicines.   Eula Listen, PA-C [facility] [address] [place], [place] [address] [phone] [facility] 02/12/2015, 2:17 PM

## 2015-02-13 LAB — LIPID PANEL
Chol/HDL Ratio: 3.7 ratio units (ref 0.0–5.0)
Cholesterol, Total: 201 mg/dL — ABNORMAL HIGH (ref 100–199)
HDL: 54 mg/dL (ref >39)
LDL Calculated: 116 mg/dL — ABNORMAL HIGH (ref 0–99)
TRIGLYCERIDES: 153 mg/dL — AB (ref 0–149)
VLDL CHOLESTEROL CAL: 31 mg/dL (ref 5–40)

## 2015-02-13 LAB — BASIC METABOLIC PANEL
BUN/Creatinine Ratio: 14 (ref 9–20)
BUN: 14 mg/dL (ref 6–24)
CALCIUM: 8.8 mg/dL (ref 8.7–10.2)
CO2: 23 mmol/L (ref 18–29)
Chloride: 104 mmol/L (ref 97–106)
Creatinine, Ser: 0.97 mg/dL (ref 0.76–1.27)
GFR, EST AFRICAN AMERICAN: 107 mL/min/{1.73_m2} (ref >59)
GFR, EST NON AFRICAN AMERICAN: 93 mL/min/{1.73_m2} (ref >59)
Glucose: 90 mg/dL (ref 65–99)
Potassium: 4.5 mmol/L (ref 3.5–5.2)
Sodium: 144 mmol/L (ref 136–144)

## 2015-02-14 ENCOUNTER — Other Ambulatory Visit: Payer: Self-pay

## 2015-02-14 DIAGNOSIS — E785 Hyperlipidemia, unspecified: Secondary | ICD-10-CM

## 2015-02-14 MED ORDER — LOVASTATIN 20 MG PO TABS: 20.0000 mg | ORAL_TABLET | Freq: Every day | ORAL | Status: DC

## 2015-02-19 ENCOUNTER — Telehealth: Payer: Self-pay

## 2015-02-19 ENCOUNTER — Other Ambulatory Visit: Payer: Self-pay

## 2015-02-19 DIAGNOSIS — E785 Hyperlipidemia, unspecified: Secondary | ICD-10-CM

## 2015-02-19 NOTE — Telephone Encounter (Signed)
There will be an potential interaction with diltiazem with any statin other than Crestor 2/2 its metabolism. At this time I do not think he needs Crestor. Will hold off on statin and have the patient work hard on lifestyle modification. Recheck lipid in 4 months. Given his young age would not change to beta blocker.

## 2015-02-19 NOTE — Telephone Encounter (Signed)
S/w Vernona RiegerLaura regarding discontinuation of statin who states she will cancel prescription.  Left detailed message w/CB number on pt VM. Lab orders placed. Statin order d/c'd.

## 2015-02-19 NOTE — Telephone Encounter (Signed)
Pharmacist calling regarding interaction between Lovastatin with Cartia. Please call.

## 2015-02-19 NOTE — Telephone Encounter (Signed)
S/w Vernona RiegerLaura at [facility] who [facility]. Prescription has never been filled. Vernona RiegerLaura states there is a red DUR flag regarding interaction between lovastatin and Diltiazem. Would like prescriber to review and advise.  Forward to Albertson'syan Dunn

## 2015-03-14 ENCOUNTER — Encounter: Payer: Self-pay | Admitting: Physician Assistant

## 2015-03-14 ENCOUNTER — Ambulatory Visit: Payer: Self-pay | Admitting: Physician Assistant

## 2015-04-14 ENCOUNTER — Other Ambulatory Visit: Payer: Self-pay

## 2015-04-14 ENCOUNTER — Emergency Department: Payer: Self-pay

## 2015-04-14 ENCOUNTER — Encounter: Payer: Self-pay | Admitting: Emergency Medicine

## 2015-04-14 ENCOUNTER — Emergency Department
Admission: EM | Admit: 2015-04-14 | Discharge: 2015-04-15 | Disposition: A | Discharge status: Home / Self Care | Payer: Self-pay | Attending: Emergency Medicine | Admitting: Emergency Medicine

## 2015-04-14 DIAGNOSIS — R61 Generalized hyperhidrosis: Secondary | ICD-10-CM | POA: Insufficient documentation

## 2015-04-14 DIAGNOSIS — Z79899 Other long term (current) drug therapy: Secondary | ICD-10-CM | POA: Insufficient documentation

## 2015-04-14 DIAGNOSIS — R079 Chest pain, unspecified: Secondary | ICD-10-CM | POA: Insufficient documentation

## 2015-04-14 DIAGNOSIS — Z7982 Long term (current) use of aspirin: Secondary | ICD-10-CM | POA: Insufficient documentation

## 2015-04-14 DIAGNOSIS — E119 Type 2 diabetes mellitus without complications: Secondary | ICD-10-CM | POA: Insufficient documentation

## 2015-04-14 DIAGNOSIS — R002 Palpitations: Secondary | ICD-10-CM | POA: Insufficient documentation

## 2015-04-14 DIAGNOSIS — I10 Essential (primary) hypertension: Secondary | ICD-10-CM | POA: Insufficient documentation

## 2015-04-14 LAB — CBC
HCT: 43.1 % (ref 40.0–52.0)
HEMOGLOBIN: 14.7 g/dL (ref 13.0–18.0)
MCH: 31.5 pg (ref 26.0–34.0)
MCHC: 34.2 g/dL (ref 32.0–36.0)
MCV: 92.2 fL (ref 80.0–100.0)
PLATELETS: 181 10*3/uL (ref 150–440)
RBC: 4.67 MIL/uL (ref 4.40–5.90)
RDW: 13 % (ref 11.5–14.5)
WBC: 5.2 10*3/uL (ref 3.8–10.6)

## 2015-04-14 LAB — BASIC METABOLIC PANEL
ANION GAP: 11 (ref 5–15)
BUN: 13 mg/dL (ref 6–20)
CALCIUM: 8.8 mg/dL — AB (ref 8.9–10.3)
CO2: 29 mmol/L (ref 22–32)
CREATININE: 0.88 mg/dL (ref 0.61–1.24)
Chloride: 97 mmol/L — ABNORMAL LOW (ref 101–111)
Glucose, Bld: 186 mg/dL — ABNORMAL HIGH (ref 65–99)
Potassium: 3.5 mmol/L (ref 3.5–5.1)
Sodium: 137 mmol/L (ref 135–145)

## 2015-04-14 LAB — TROPONIN I

## 2015-04-14 MED ORDER — CLONIDINE HCL 0.1 MG PO TABS
0.1000 mg | ORAL_TABLET | Freq: Once | ORAL | Status: AC
  Administered 2015-04-14: 0.1 mg via ORAL
  Filled 2015-04-14: qty 1

## 2015-04-14 MED ORDER — GI COCKTAIL ~~LOC~~
30.0000 mL | Freq: Once | ORAL | Status: AC
  Administered 2015-04-14: 30 mL via ORAL
  Filled 2015-04-14: qty 30

## 2015-04-14 NOTE — ED Notes (Signed)
Pt states feeling like he has htn, pt states has sensation of "my heart fluttering". Pt denies dizziness, shob, nausea. Pt states "i was sweating earlier"

## 2015-04-14 NOTE — ED Provider Notes (Signed)
[facility] Emergency Department Provider Note  ____________________________________________  Time seen: Approximately 11:22 PM  I have reviewed the triage vital signs and the nursing notes.   HISTORY  Chief Complaint Hypertension and Palpitations    HPI Jordan Cochran is a 49 y.o. male who presents to the ED from home with the chief complaint of high blood pressure and palpitations. Patient ate a banana approximately 7 PM, then subsequently felt a sensation of his heart fluttering, felt like his blood pressure was elevated and had a brief episode of "heartburn". Patient denies associated symptoms of diaphoresis, shortness of breath, nausea, vomiting, dizziness. Triage note reports patient stated he was diaphoretic; however, patient denies this to me. Patient takes diltiazem and hydrochlorothiazide and states he is compliant with his antihypertensives. He does state that he has been taking decongestants for cold type symptoms all this week. Denies recent travel or trauma. Denies fever, chills, abdominal pain, diarrhea, headache, vision changes. Nothing makes his symptoms better or worse.   Past Medical History  Diagnosis Date  . Hypertension   . ETOH abuse   . PAF (paroxysmal atrial fibrillation) (HCC)     a. not on long term full dose anticoagulation given CHADSVASc => 1 (HTN)  . History of echocardiogram     a. 12/2014: EF 55-60%, nl wall motion, nl diastolic fxn  . Diabetes mellitus (HCC)     a. A1C 6.6% in hospital 12/2014  . Obesity     Patient Active Problem List   Diagnosis Date Noted  . Diabetes mellitus (HCC)   . Obesity   . PAF (paroxysmal atrial fibrillation) (HCC)   . ETOH abuse   . Hypertension   . Atrial fibrillation with RVR (HCC) 01/18/2015  . HTN (hypertension) 01/18/2015  . Hypokalemia 01/18/2015    History reviewed. No pertinent past surgical history.  Current Outpatient Rx  Name  Route  Sig  Dispense  Refill  . aspirin EC 81  MG EC tablet   Oral   Take 1 tablet (81 mg total) by mouth daily.         . cetirizine (ZYRTEC) 10 MG tablet   Oral   Take 10 mg by mouth daily.         Marland Kitchen diltiazem (CARDIZEM CD) 180 MG 24 hr capsule   Oral   Take 1 capsule (180 mg total) by mouth daily.   30 capsule   5   . hydrochlorothiazide (MICROZIDE) 12.5 MG capsule   Oral   Take 1 capsule (12.5 mg total) by mouth daily.   30 capsule   3   . ibuprofen (ADVIL,MOTRIN) 200 MG tablet   Oral   Take 200 mg by mouth every 6 (six) hours as needed.         . pseudoephedrine-acetaminophen (TYLENOL SINUS) 30-500 MG TABS tablet   Oral   Take 1 tablet by mouth every 4 (four) hours as needed.           Allergies Review of patient's allergies indicates no known allergies.  Family History  Problem Relation Age of Onset  . Hypertension Mother   . Hypertension Father   . Hypertension Sister   . Hypertension Brother     Social History Social History  Substance Use Topics  . Smoking status: Never Smoker   . Smokeless tobacco: Never Used  . Alcohol Use: Yes     Comment: 3 drinks and 2 shots today. 4/7 days    Review of Systems Constitutional: No  fever/chills Eyes: No visual changes. ENT: No sore throat. Cardiovascular: Positive for palpitations and chest pain. Respiratory: Denies shortness of breath. Gastrointestinal: No abdominal pain.  No nausea, no vomiting.  No diarrhea.  No constipation. Genitourinary: Negative for dysuria. Musculoskeletal: Negative for back pain. Skin: Negative for rash. Neurological: Negative for headaches, focal weakness or numbness.  10-point ROS otherwise negative.  ____________________________________________   PHYSICAL EXAM:  VITAL SIGNS: ED Triage Vitals  Enc Vitals Group     BP 04/14/15 2152 177/112 mmHg     Pulse Rate 04/14/15 2152 88     Resp 04/14/15 2149 16     Temp 04/14/15 2152 98.2 F (36.8 C)     Temp Source 04/14/15 2152 Oral     SpO2 04/14/15 2152 97 %      Weight 04/14/15 2149 190 lb (86.183 kg)     Height 04/14/15 2149  (1.676 m)     Head Cir --      Peak Flow --      Pain Score --      Pain Loc --      Pain Edu? --      Excl. in GC? --     Constitutional: Alert and oriented. Well appearing and in no acute distress. Eyes: Conjunctivae are normal. PERRL. EOMI. Head: Atraumatic. Nose: No congestion/rhinnorhea. Mouth/Throat: Mucous membranes are moist.  Oropharynx non-erythematous. Neck: No stridor.   Cardiovascular: Normal rate, regular rhythm. Grossly normal heart sounds.  Good peripheral circulation. Respiratory: Normal respiratory effort.  No retractions. Lungs CTAB. Gastrointestinal: Soft and nontender. No distention. No abdominal bruits. No CVA tenderness. Musculoskeletal: No lower extremity tenderness nor edema.  No joint effusions. Neurologic:  Normal speech and language. No gross focal neurologic deficits are appreciated. No gait instability. Skin:  Skin is warm, dry and intact. No rash noted. Psychiatric: Mood and affect are normal. Speech and behavior are normal.  ____________________________________________   LABS (all labs ordered are listed, but only abnormal results are displayed)  Labs Reviewed  BASIC METABOLIC PANEL - Abnormal; Notable for the following:    Chloride 97 (*)    Glucose, Bld 186 (*)    Calcium 8.8 (*)    All other components within normal limits  CBC  TROPONIN I  ETHANOL  TROPONIN I  URINE DRUG SCREEN, QUALITATIVE ([facility] ONLY)   ____________________________________________  EKG  ED ECG REPORT I, Ellowyn Rieves J, the attending physician, personally viewed and interpreted this ECG.   Date: 04/14/2015  EKG Time: 2149  Rate: 87  Rhythm: normal EKG, normal sinus rhythm  Axis: Normal  Intervals:none  ST&T Change: Nonspecific  ____________________________________________  RADIOLOGY  Chest 2 view (view by me, interpreted per Dr. Cherly Hensen): Minimal left basilar opacity likely reflects  atelectasis. Lungs otherwise clear. ____________________________________________   PROCEDURES  Procedure(s) performed: None  Critical Care performed: No  ____________________________________________   INITIAL IMPRESSION / ASSESSMENT AND PLAN / ED COURSE  Pertinent labs & imaging results that were available during my care of the patient were reviewed by me and considered in my medical decision making (see chart for details).  49 year old male who presents with elevated blood pressure and palpitations; initial troponin negative. Will repeat troponin, obtain CXR, administer anti-hypertensive and reassess.  ----------------------------------------- 1:51 AM on 04/15/2015 -----------------------------------------  Pressure improved. Updated patient and spouse of negative repeat troponin. Advised patient to discontinue use of decongestants as this likely elevates his blood pressure. Strict return precautions given. Patient and spouse verbalize understanding and agreed with plan  of care. ____________________________________________   FINAL CLINICAL IMPRESSION(S) / ED DIAGNOSES  Final diagnoses:  Palpitations  Essential hypertension      Irean HongJade J Trevyon Swor, MD 04/15/15 [id number]

## 2015-04-15 LAB — URINE DRUG SCREEN, QUALITATIVE (ARMC ONLY)
AMPHETAMINES, UR SCREEN: NOT DETECTED
BARBITURATES, UR SCREEN: NOT DETECTED
BENZODIAZEPINE, UR SCRN: NOT DETECTED
Cannabinoid 50 Ng, Ur ~~LOC~~: NOT DETECTED
Cocaine Metabolite,Ur ~~LOC~~: NOT DETECTED
MDMA (Ecstasy)Ur Screen: NOT DETECTED
METHADONE SCREEN, URINE: NOT DETECTED
Opiate, Ur Screen: NOT DETECTED
Phencyclidine (PCP) Ur S: NOT DETECTED
TRICYCLIC, UR SCREEN: NOT DETECTED

## 2015-04-15 LAB — TROPONIN I

## 2015-04-15 LAB — ETHANOL

## 2015-04-15 NOTE — Discharge Instructions (Signed)
1. Discontinue use of decongestants as these will elevate her blood pressure. 2. Continue to take your blood pressure medicines daily. 3. Return to the ER for worsening symptoms, persistent vomiting, difficulty breathing or other concerns.  Palpitations A palpitation is the feeling that your heartbeat is irregular or is faster than normal. It may feel like your heart is fluttering or skipping a beat. Palpitations are usually not a serious problem. However, in some cases, you may need further medical evaluation. CAUSES  Palpitations can be caused by:  Smoking.  Caffeine or other stimulants, such as diet pills or energy drinks.  Alcohol.  Stress and anxiety.  Strenuous physical activity.  Fatigue.  Certain medicines.  Heart disease, especially if you have a history of irregular heart rhythms (arrhythmias), such as atrial fibrillation, atrial flutter, or supraventricular tachycardia.  An improperly working pacemaker or defibrillator. DIAGNOSIS  To find the cause of your palpitations, your health care provider will take your medical history and perform a physical exam. Your health care provider may also have you take a test called an ambulatory electrocardiogram (ECG). An ECG records your heartbeat patterns over a 24-hour period. You may also have other tests, such as:  Transthoracic echocardiogram (TTE). During echocardiography, sound waves are used to evaluate how blood flows through your heart.  Transesophageal echocardiogram (TEE).  Cardiac monitoring. This allows your health care provider to monitor your heart rate and rhythm in real time.  Holter monitor. This is a portable device that records your heartbeat and can help diagnose heart arrhythmias. It allows your health care provider to track your heart activity for several days, if needed.  Stress tests by exercise or by giving medicine that makes the heart beat faster. TREATMENT  Treatment of palpitations depends on the  cause of your symptoms and can vary greatly. Most cases of palpitations do not require any treatment other than time, relaxation, and monitoring your symptoms. Other causes, such as atrial fibrillation, atrial flutter, or supraventricular tachycardia, usually require further treatment. HOME CARE INSTRUCTIONS   Avoid:  Caffeinated coffee, tea, soft drinks, diet pills, and energy drinks.  Chocolate.  Alcohol.  Stop smoking if you smoke.  Reduce your stress and anxiety. Things that can help you relax include:  A method of controlling things in your body, such as your heartbeats, with your mind (biofeedback).  Yoga.  Meditation.  Physical activity such as swimming, jogging, or walking.  Get plenty of rest and sleep. SEEK MEDICAL CARE IF:   You continue to have a fast or irregular heartbeat beyond 24 hours.  Your palpitations occur more often. SEEK IMMEDIATE MEDICAL CARE IF:  You have chest pain or shortness of breath.  You have a severe headache.  You feel dizzy or you faint. MAKE SURE YOU:  Understand these instructions.  Will watch your condition.  Will get help right away if you are not doing well or get worse.   This information is not intended to replace advice given to you by your health care provider. Make sure you discuss any questions you have with your health care provider.   Document Released: 04/04/2000 Document Revised: 04/12/2013 Document Reviewed: 06/06/2011 Elsevier Interactive Patient Education 2016 [employer]  Hypertension Hypertension, commonly called high blood pressure, is when the force of blood pumping through your arteries is too strong. Your arteries are the blood vessels that carry blood from your heart throughout your body. A blood pressure reading consists of a higher number over a lower number, such as 110/72.  The higher number (systolic) is the pressure inside your arteries when your heart pumps. The lower number (diastolic) is the  pressure inside your arteries when your heart relaxes. Ideally you want your blood pressure below 120/80. Hypertension forces your heart to work harder to pump blood. Your arteries may become narrow or stiff. Having untreated or uncontrolled hypertension can cause heart attack, stroke, kidney disease, and other problems. RISK FACTORS Some risk factors for high blood pressure are controllable. Others are not.  Risk factors you cannot control include:   Race. You may be at higher risk if you are African American.  Age. Risk increases with age.  Gender. Men are at higher risk than women before age 13 years. After age 61, women are at higher risk than men. Risk factors you can control include:  Not getting enough exercise or physical activity.  Being overweight.  Getting too much fat, sugar, calories, or salt in your diet.  Drinking too much alcohol. SIGNS AND SYMPTOMS Hypertension does not usually cause signs or symptoms. Extremely high blood pressure (hypertensive crisis) may cause headache, anxiety, shortness of breath, and nosebleed. DIAGNOSIS To check if you have hypertension, your health care provider will measure your blood pressure while you are seated, with your arm held at the level of your heart. It should be measured at least twice using the same arm. Certain conditions can cause a difference in blood pressure between your right and left arms. A blood pressure reading that is higher than normal on one occasion does not mean that you need treatment. If it is not clear whether you have high blood pressure, you may be asked to return on a different day to have your blood pressure checked again. Or, you may be asked to monitor your blood pressure at home for 1 or more weeks. TREATMENT Treating high blood pressure includes making lifestyle changes and possibly taking medicine. Living a healthy lifestyle can help lower high blood pressure. You may need to change some of your  habits. Lifestyle changes may include:  Following the DASH diet. This diet is high in fruits, vegetables, and whole grains. It is low in salt, red meat, and added sugars.  Keep your sodium intake below 2,300 mg per day.  Getting at least 30-45 minutes of aerobic exercise at least 4 times per week.  Losing weight if necessary.  Not smoking.  Limiting alcoholic beverages.  Learning ways to reduce stress. Your health care provider may prescribe medicine if lifestyle changes are not enough to get your blood pressure under control, and if one of the following is true:  You are 91-56 years of age and your systolic blood pressure is above 140.  You are 21 years of age or older, and your systolic blood pressure is above 150.  Your diastolic blood pressure is above 90.  You have diabetes, and your systolic blood pressure is over 140 or your diastolic blood pressure is over 90.  You have kidney disease and your blood pressure is above 140/90.  You have heart disease and your blood pressure is above 140/90. Your personal target blood pressure may vary depending on your medical conditions, your age, and other factors. HOME CARE INSTRUCTIONS  Have your blood pressure rechecked as directed by your health care provider.   Take medicines only as directed by your health care provider. Follow the directions carefully. Blood pressure medicines must be taken as prescribed. The medicine does not work as well when you skip doses. Skipping doses  also puts you at risk for problems.  Do not smoke.   Monitor your blood pressure at home as directed by your health care provider. SEEK MEDICAL CARE IF:   You think you are having a reaction to medicines taken.  You have recurrent headaches or feel dizzy.  You have swelling in your ankles.  You have trouble with your vision. SEEK IMMEDIATE MEDICAL CARE IF:  You develop a severe headache or confusion.  You have unusual weakness, numbness, or  feel faint.  You have severe chest or abdominal pain.  You vomit repeatedly.  You have trouble breathing. MAKE SURE YOU:   Understand these instructions.  Will watch your condition.  Will get help right away if you are not doing well or get worse.   This information is not intended to replace advice given to you by your health care provider. Make sure you discuss any questions you have with your health care provider.   Document Released: 04/07/2005 Document Revised: 08/22/2014 Document Reviewed: 01/28/2013 Elsevier Interactive Patient Education [employer]

## 2015-06-18 ENCOUNTER — Other Ambulatory Visit: Payer: Self-pay

## 2015-07-16 ENCOUNTER — Encounter: Payer: Self-pay | Admitting: Emergency Medicine

## 2015-07-16 ENCOUNTER — Ambulatory Visit
Admission: EM | Admit: 2015-07-16 | Discharge: 2015-07-16 | Disposition: A | Discharge status: Home / Self Care | Payer: Self-pay | Attending: Emergency Medicine | Admitting: Emergency Medicine

## 2015-07-16 DIAGNOSIS — Z760 Encounter for issue of repeat prescription: Secondary | ICD-10-CM

## 2015-07-16 DIAGNOSIS — I159 Secondary hypertension, unspecified: Secondary | ICD-10-CM

## 2015-07-16 MED ORDER — HYDROCHLOROTHIAZIDE 12.5 MG PO CAPS: 12.5000 mg | ORAL_CAPSULE | Freq: Every day | ORAL | Status: AC

## 2015-07-16 NOTE — ED Notes (Signed)
Patient states that he needs a primary physician and needs refill on his blood pressure medication. Patient states that he has been out of his medicine for couple of weeks.

## 2015-07-16 NOTE — Discharge Instructions (Signed)
Decrease your salt intake. diet and exercise will lower your blood pressure significantly. It is important to keep your blood pressure under good control, as having a elevated for prolonged periods of time significantly increases your risk of stroke, heart attacks, kidney damage, eye damage, and other problems. Return immediately to the ER if you start having chest pain, headache, problems seeing, problems talking, problems walking, if you feel like you're about to pass out, if you do pass out, if you have a seizure, or for any other concerns.  Start taking  a multivitamin one pill once a day as well.

## 2015-07-16 NOTE — ED Provider Notes (Signed)
HPI  SUBJECTIVE:  Jordan Cochran is a 50 y.o. male who presents for medication refill of his hydrochlorothiazide and diltiazem. Patient has run out of his hydrocodone thiazide several days ago, but still has his diltiazem. States that his blood pressure has been running the 150s to 160s / 90s to 100s for the past several days. He states that his normal baseline is in the 160s- 70s/90s -100s "since high school". He does have a refill of the diltiazem on the bottle that he brought in. Patient currently has no complaints. Currently denies headaches, visual changes, arm or leg weakness, facial droop, chest pain, shortness of breath, lower extremity edema, tearing pain radiating through his back or abdomen, abdominal pain, anuria, hematuria. No history of decongestants or cocaine use. He has a past medical history of paroxysmal atrial fibrillation, states that he is not supposed to be on anticoagulants. Chart review shows that he is not to be on long term full dose anticoagulants. Also hypertension, prediabetes per patient. Last hgb A1c 6.6 on 12/2014. Chart indicates he has a history of alcohol abuse, patient states he drinks 2 beers and a shot only on the weekends. He denies any history of ETOH abuse. Patient also states he has does not have a PMD and is requesting a referral.   Past Medical History  Diagnosis Date  . Hypertension   . ETOH abuse   . PAF (paroxysmal atrial fibrillation) (HCC)     a. not on long term full dose anticoagulation given CHADSVASc => 1 (HTN)  . History of echocardiogram     a. 12/2014: EF 55-60%, nl wall motion, nl diastolic fxn  . Diabetes mellitus (HCC)     a. A1C 6.6% in hospital 12/2014  . Obesity     History reviewed. No pertinent past surgical history.  Family History  Problem Relation Age of Onset  . Hypertension Mother   . Hypertension Father   . Hypertension Sister   . Hypertension Brother     Social History  Substance Use Topics  . Smoking status:  Never Smoker   . Smokeless tobacco: Never Used  . Alcohol Use: Yes     Comment: 3 drinks and 2 shots today. 4/7 days    No current facility-administered medications for this encounter.  Current outpatient prescriptions:  .  aspirin EC 81 MG EC tablet, Take 1 tablet (81 mg total) by mouth daily., Disp: , Rfl:  .  cetirizine (ZYRTEC) 10 MG tablet, Take 10 mg by mouth daily., Disp: , Rfl:  .  diltiazem (CARDIZEM CD) 180 MG 24 hr capsule, Take 1 capsule (180 mg total) by mouth daily., Disp: 30 capsule, Rfl: 5 .  hydrochlorothiazide (MICROZIDE) 12.5 MG capsule, Take 1 capsule (12.5 mg total) by mouth daily., Disp: 30 capsule, Rfl: 0  No Known Allergies   ROS  As noted in HPI.   Physical Exam  BP 171/97 mmHg  Pulse 64  Temp(Src) 97.4 F (36.3 C) (Tympanic)  Resp 16  Ht 5\' 6"  (1.676 m)  Wt 187 lb (84.823 kg)  BMI 30.20 kg/m2  SpO2 100%   BP Readings from Last 3 Encounters:  07/16/15 171/97  04/15/15 154/107  02/12/15 140/78    Constitutional: Well developed, well nourished, no acute distress Eyes: PERRL, EOMI, conjunctiva normal bilaterally HENT: Normocephalic, atraumatic,mucus membranes moist Respiratory: Clear to auscultation bilaterally, no rales, no wheezing, no rhonchi Cardiovascular: Normal rate and rhythm, no murmurs, no gallops, no rubs GI: Soft, nondistended, normal bowel sounds, nontender, no  rebound, no guarding Back: no CVAT skin: No rash, skin intact Musculoskeletal: No edema, no tenderness, no deformities Neurologic: Alert & oriented x 3, CN II-XII grossly intact, no motor deficits, sensation grossly intact Psychiatric: Speech and behavior appropriate   ED Course   Medications - No data to display  No orders of the defined types were placed in this encounter.   No results found for this or any previous visit (from the past 24 hour(s)). No results found.  ED Clinical Impression  Secondary hypertension, unspecified  Medication refill   ED  Assessment/Plan  Pt hypertensive today. States BP has been running in this range recently.  Has not taken BP meds today. Pt has no evidence of end organ damage. Patient has a refill on his Cardizem, and advised he does not need a new prescription for this today. will refill his hydrochlorothiazide 12.5 mg once daily with no refills. Advised patient to keep an eye on his blood pressure and discussed signs and symptoms that should prompt a return to the emergency room. Gave patient information for [facility] for follow-up. Patient agrees with plan   *This clinic note was created using Dragon dictation software. Therefore, there may be occasional mistakes despite careful proofreading.  ?  Domenick Gong, MD 07/16/15 [phone]

## 2017-01-14 IMAGING — CR DG CHEST 1V PORT
1 series · 1 of 1 positions shown · non-contrast
Comparison: None.

CLINICAL DATA: Palpitations.

EXAM:
PORTABLE CHEST 1 VIEW

[portable]
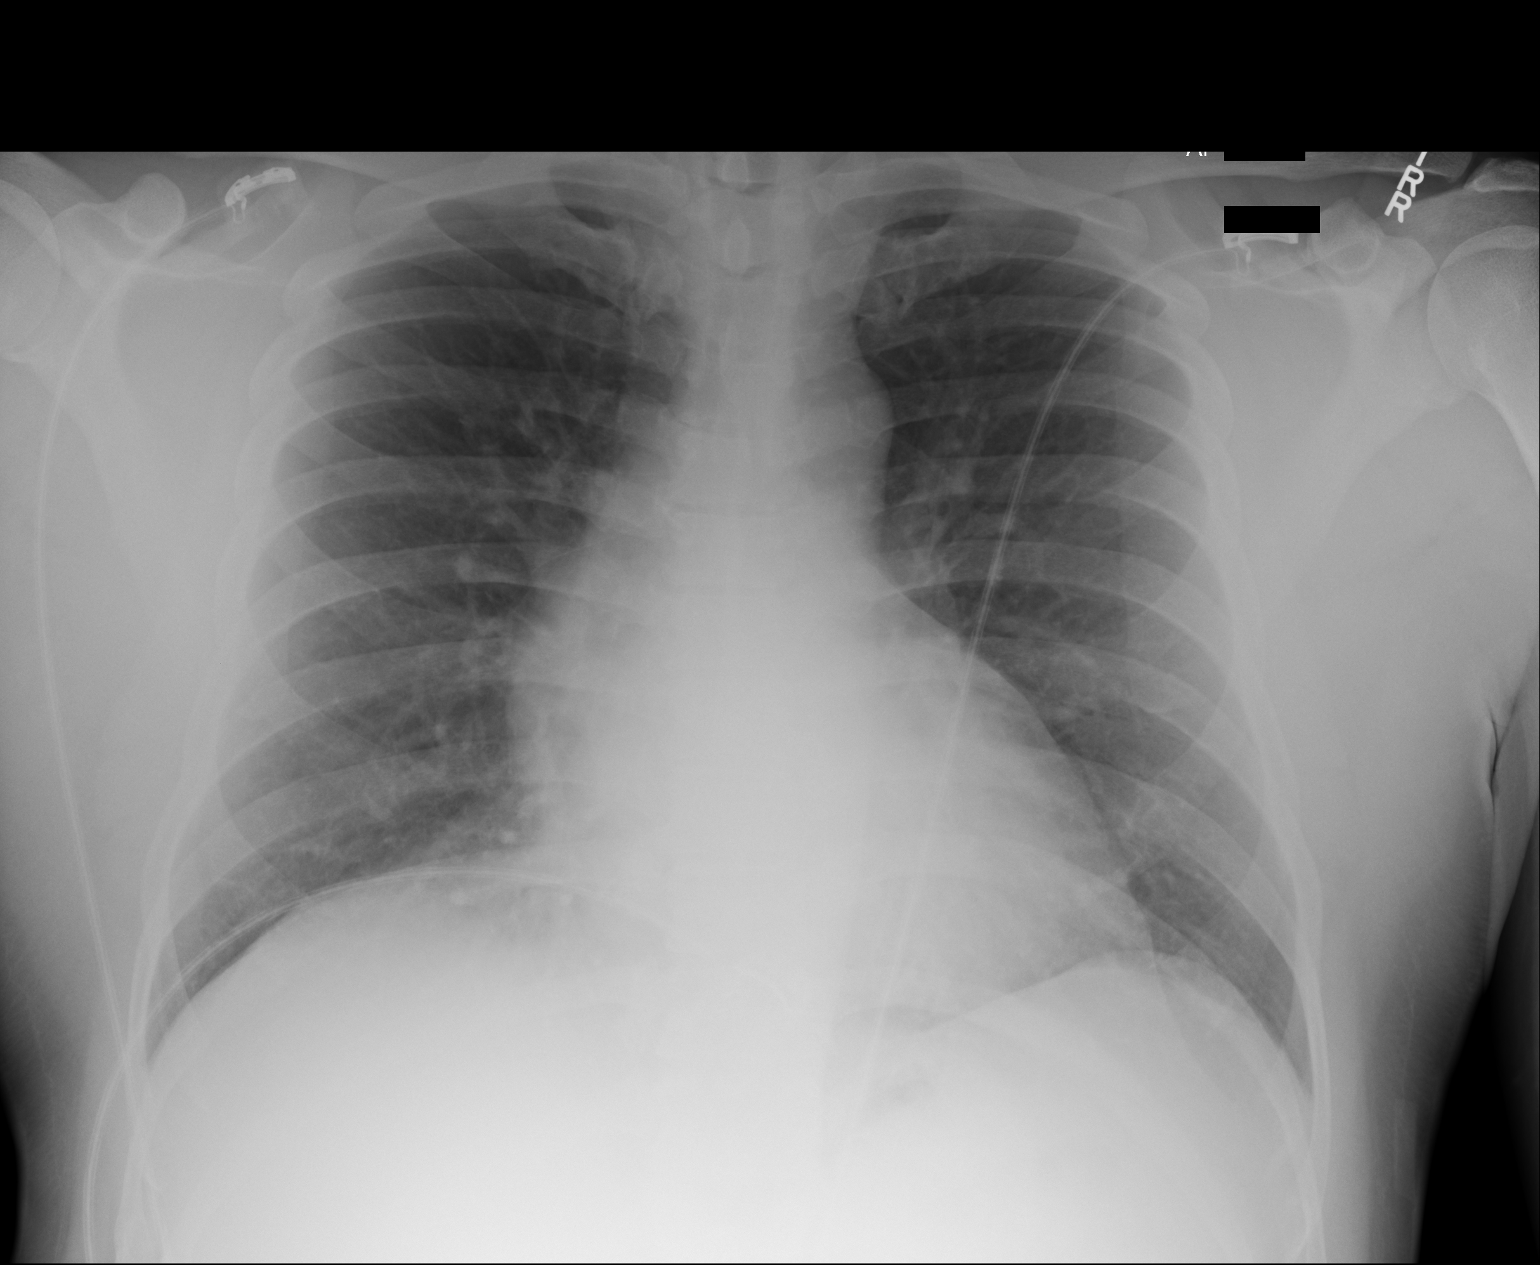

[1 of 1 positions shown; findings below may reference images not displayed]

FINDINGS: The heart is at the upper limits of normal in size. Mediastinal
contours are normal. Pulmonary vasculature is normal. No
consolidation, pleural effusion, or pneumothorax. No acute osseous
abnormalities are seen.
IMPRESSION: Borderline cardiomegaly without localizing pulmonary process or
congestive failure.

## 2019-07-11 ENCOUNTER — Ambulatory Visit: Payer: Self-pay | Attending: Internal Medicine

## 2019-07-11 DIAGNOSIS — Z23 Encounter for immunization: Secondary | ICD-10-CM

## 2019-07-11 NOTE — Progress Notes (Signed)
   Covid-19 Vaccination Clinic  Name:  Jordan Cochran    MRN: [id number] DOB: Oct 23, 1965  07/11/2019  Mr. Cacciola was observed post Covid-19 immunization for 15 minutes without incident. He was provided with Vaccine Information Sheet and instruction to access the V-Safe system.   Mr. Funes was instructed to call 911 with any severe reactions post vaccine: Marland Kitchen Difficulty breathing  . Swelling of face and throat  . A fast heartbeat  . A bad rash all over body  . Dizziness and weakness   Immunizations Administered    Name Date Dose VIS Date Route   Pfizer COVID-19 Vaccine 07/11/2019 11:31 AM 0.3 mL 04/01/2019 Intramuscular   Manufacturer: [employer], [employer]   Lot: [id number]   NDC: [address]-5

## 2019-08-03 ENCOUNTER — Ambulatory Visit: Payer: Self-pay | Attending: Internal Medicine

## 2019-08-03 DIAGNOSIS — Z23 Encounter for immunization: Secondary | ICD-10-CM

## 2019-08-03 NOTE — Progress Notes (Signed)
   Covid-19 Vaccination Clinic  Name:  GAYLAN FAUVER    MRN: [id number] DOB: 12/29/1965  08/03/2019  Mr. Kerstein was observed post Covid-19 immunization for 15 minutes without incident. He was provided with Vaccine Information Sheet and instruction to access the V-Safe system.   Mr. Kiel was instructed to call 911 with any severe reactions post vaccine: Marland Kitchen Difficulty breathing  . Swelling of face and throat  . A fast heartbeat  . A bad rash all over body  . Dizziness and weakness   Immunizations Administered    Name Date Dose VIS Date Route   Pfizer COVID-19 Vaccine 08/03/2019 11:12 AM 0.3 mL 04/01/2019 Intramuscular   Manufacturer: [employer], [employer]   Lot: [id number]   NDC: [address]-7
